# Patient Record
Sex: Female | Born: 1955 | Race: White | Hispanic: No | Marital: Married | Smoking: Former smoker
Health system: Southern US, Community
[De-identification: ages and names within clinical notes are randomized; demographics above are authoritative.]

## PROBLEM LIST (undated history)

## (undated) DIAGNOSIS — F419 Anxiety disorder, unspecified: Secondary | ICD-10-CM

## (undated) DIAGNOSIS — B1920 Unspecified viral hepatitis C without hepatic coma: Secondary | ICD-10-CM

## (undated) DIAGNOSIS — I10 Essential (primary) hypertension: Secondary | ICD-10-CM

## (undated) HISTORY — DX: Essential (primary) hypertension: I10

## (undated) HISTORY — DX: Anxiety disorder, unspecified: F41.9

## (undated) HISTORY — DX: Unspecified viral hepatitis C without hepatic coma: B19.20

---

## 2014-11-06 ENCOUNTER — Other Ambulatory Visit (HOSPITAL_COMMUNITY): Payer: Self-pay | Admitting: Nurse Practitioner

## 2014-11-06 DIAGNOSIS — B182 Chronic viral hepatitis C: Secondary | ICD-10-CM

## 2014-11-15 ENCOUNTER — Ambulatory Visit (HOSPITAL_COMMUNITY)
Admission: RE | Admit: 2014-11-15 | Discharge: 2014-11-15 | Disposition: A | Discharge status: Home / Self Care | Payer: 59 | Source: Ambulatory Visit | Attending: Nurse Practitioner | Admitting: Nurse Practitioner

## 2014-11-15 DIAGNOSIS — B182 Chronic viral hepatitis C: Secondary | ICD-10-CM | POA: Insufficient documentation

## 2014-11-15 DIAGNOSIS — I77811 Abdominal aortic ectasia: Secondary | ICD-10-CM | POA: Insufficient documentation

## 2014-11-15 DIAGNOSIS — K760 Fatty (change of) liver, not elsewhere classified: Secondary | ICD-10-CM | POA: Diagnosis not present

## 2015-01-31 ENCOUNTER — Encounter: Payer: Self-pay | Admitting: Internal Medicine

## 2015-01-31 ENCOUNTER — Ambulatory Visit (INDEPENDENT_AMBULATORY_CARE_PROVIDER_SITE_OTHER): Payer: 59 | Admitting: Internal Medicine

## 2015-01-31 VITALS — BP 180/109 | HR 68 | Temp 97.4°F | Ht 64.0 in | Wt 184.0 lb

## 2015-01-31 DIAGNOSIS — B182 Chronic viral hepatitis C: Secondary | ICD-10-CM | POA: Insufficient documentation

## 2015-01-31 MED ORDER — LEDIPASVIR-SOFOSBUVIR 90-400 MG PO TABS: 1.0000 | ORAL_TABLET | Freq: Every day | ORAL | Status: DC

## 2015-01-31 NOTE — Patient Instructions (Signed)
Date 01/31/15  Dear Ms Hart RochesterLawson, As discussed in the ID Clinic, your hepatitis C therapy will include the following medications:          Harvoni 90mg /400mg  tablet:           Take 1 tablet by mouth once daily   Please note that ALL MEDICATIONS WILL START ON THE SAME DATE for a total of 12 weeks. 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  Remember, each prescription is for 28 days. ---------------------------------------------------------------- GENERAL NOTES REGARDING YOUR HEPATITIS C MEDICATION:  SOFOSBUVIR/LEDIPASVIR (HARVONI): - Harvoni tablet is taken daily with OR without food. - The tablets are orange. - The tablets should be stored at room temperature.  - Acid reducing agents such as H2 blockers (ie. Pepcid (famotidine), Zantac (ranitidine), Tagamet (cimetidine), Axid (nizatidine) and proton pump inhibitors (ie. Prilosec (omeprazole), Protonix (pantoprazole), Nexium (esomeprazole), or Aciphex (rabeprazole)) can decrease effectiveness of Harvoni. Do not take until you have discussed with a health care provider.    -Antacids that contain magnesium and/or aluminum hydroxide (ie. Milk of Magensia, Rolaids, Gaviscon, Maalox, Mylanta, an dArthritis Pain Formula)can reduce absorption of Harvoni, so take them at least 4 hours before or after Harvoni.  -Calcium carbonate (calcium supplements or antacids such as Tums, Caltrate,  Os-Cal)needs to be taken at least 4 hours hours before or after Harvoni.  -St. John's wort or any products that contain St. John's wort like some herbal supplements  Please inform the office prior to starting any of these medications.  - The common side effects with Harvoni:      1. Fatigue      2. Headache      3. Nausea      4. Diarrhea      5. Insomnia   Support Path is a suite of resources designed to help patients start with HARVONI and move toward treatment completion GETTING STARTED Support Path helps patients access therapy and get off to an efficient start  Benefits investigation and prior authorization support Co-pay and other financial assistance A specialty pharmacy finder CO-PAY COUPON The HARVONI co-pay coupon may help eligible patients lower their out-of-pocket costs. With a co-pay coupon, most eligible patients may pay no more than $5 per co-pay (restrictions apply) www.harvoni.com call [phone] Not valid for patients enrolled in government healthcare prescription drug programs, such as Medicare Part D and Medicaid. Patients in the coverage gap known as the "donut hole" also are not eligible The HARVONI co-pay coupon program will cover the out-of-pocket costs for HARVONI prescriptions up to a maximum of 25% of the catalog price of a 12-week regimen of HARVONI  Please note that this only lists the most common side effects and is NOT a comprehensive list of the potential side effects of these medications. For more information, please review the drug information sheets that come with your medication package from the pharmacy.  ---------------------------------------------------------------- GENERAL HELPFUL HINTS ON HCV THERAPY: 1. No alcohol. 2. Protect against sun-sensitivity/sunburns (wear sunglasses, hat, long sleeves, pants and sunscreen).  3. Stay well-hydrated/well-moisturized. 4. Notify the ID Clinic of any changes in your other over-the-counter/herbal or  prescription medications. 5. If you miss a dose of your medication, take the missed dose as soon as you remember. Return to your regular time/dose schedule the next day.  6.  Do not stop taking your medications without first talking with your healthcare provider. 7.  You may take Tylenol (acetaminophen), as long as the dose is less than 2000 mg (OR no more than 4 tablets of the Tylenol Extra Strengths  tablet) in 24 hours. 8.  You will need to obtain routine labs and/or office visits at [facility] at weeks 4 and 12 as well as 12 and 24 weeks after completion of treatment.   Staci Righter, MD  [facility] [address] [place] [address] [place], [place]  [address] [phone]

## 2015-01-31 NOTE — Progress Notes (Signed)
+Shelby Barker is a 59 y.o. female who presents for initial evaluation and management of a positive Hepatitis C antibody test.  Patient tested positive this year by routine CDC recommended testing. Hepatitis C risk factors present are: none. Patient denies history of blood transfusion, intranasal drug use, IV drug abuse, multiple sexual partners, sexual contact with person with liver disease, tattoos. Patient has had other studies performed. Results: hepatitis C RNA by PCR, result: positive, elastography F3/4, Fibrosure F2. Patient has not had prior treatment for Hepatitis C. Patient does not have a past history of liver disease. Patient does not have a family history of liver disease.   HPI: [facility] but due to her new insurance, needs to be seen here instead.  Evaluation was done by them.  She denies alcohol or drug use.    Patient does not have documented immunity to Hepatitis A. Patient does not have documented immunity to Hepatitis B.     Review of Systems A comprehensive review of systems was negative.   Past Medical History  Diagnosis Date  . Hepatitis C virus infection without hepatic coma   . Hypertension   . Anxiety     Prior to Admission medications   Medication Sig Start Date End Date Taking? Authorizing Provider  ALPRAZolam Prudy Feeler(XANAX) 0.5 MG tablet Take 0.5 mg by mouth 2 (two) times daily as needed for anxiety.   Yes Historical Provider, MD  carvedilol (COREG) 12.5 MG tablet Take 12.5 mg by mouth 2 (two) times daily with a meal.   Yes Historical Provider, MD  hydrochlorothiazide (HYDRODIURIL) 12.5 MG tablet Take 12.5 mg by mouth daily.   Yes Historical Provider, MD  Multiple Vitamins-Minerals (MULTIVITAMIN WITH MINERALS) tablet Take 1 tablet by mouth daily.   Yes Historical Provider, MD  Ledipasvir-Sofosbuvir (HARVONI) 90-400 MG TABS Take 1 tablet by mouth daily. 01/31/15   Gardiner Barefootobert W Comer, MD    Allergies  Allergen Reactions  . Codeine     History   Substance Use Topics  . Smoking status: Former Smoker    Quit date: 08/25/2009  . Smokeless tobacco: Never Used  . Alcohol Use: 0.0 oz/week    0 Standard drinks or equivalent per week     Comment: occasional    No family history on file.    Objective:   Filed Vitals:   01/31/15 1451  BP: 180/109  Pulse: 68  Temp: 97.4 F (36.3 C)   in no apparent distress and alert HEENT: anicteric Cor RRR and No murmurs clear Bowel sounds are normal, liver is not enlarged, spleen is not enlarged peripheral pulses normal, no pedal edema, no clubbing or cyanosis negative for - jaundice, spider hemangioma, telangiectasia, palmar erythema, ecchymosis and atrophy  Laboratory Genotype: No results found for: HCVGENOTYPE HCV viral load: No results found for: HCVQUANT No results found for: WBC, HGB, HCT, MCV, PLT No results found for: CREATININE, BUN, NA, K, CL, CO2 No results found for: ALT, AST, GGT, ALKPHOS, BILITOT, INR    Assessment: Chronic Hepatitis C genotype 1a  Plan: 1) Patient counseled extensively on limiting acetaminophen to no more than 2 grams daily, avoidance of alcohol. 2) Transmission discussed with patient including sexual transmission, sharing razors and toothbrush.   3) Will need referral to gastroenterology if concern for cirrhosis 4) Will need referral for substance abuse counseling: No. 5) Will prescribe Harvoni for 12 weeks once work up complete 6) Hepatitis A vaccine Yes.  had one dose, next due in  November 2016.  Will do here.  7) Hepatitis B vaccine Yes.  already had 2 doses, third due in November 2016 8) will follow up after starting medication

## 2015-02-14 ENCOUNTER — Telehealth: Payer: Self-pay | Admitting: Pharmacist Clinician (PhC)/ Clinical Pharmacy Specialist

## 2015-02-14 NOTE — Telephone Encounter (Signed)
Shelby Barker was approved for Harvoni but only for 8 wks even though she has F3/4 on her elastography. An appeal was submitted and she has been approved for 12 wks now. Called her to let her know and to see if outpt pharmacy can fill it.

## 2015-02-19 ENCOUNTER — Encounter: Payer: Self-pay | Admitting: Pharmacist

## 2015-02-19 NOTE — Progress Notes (Signed)
Patient ID: Shelby Barker, female   DOB: [birth year], 59 y.o.   MRN: [id number]   Gaelyn Springfield to let her know that she will be receiving her Harvoni from [facility] and not our outpatient pharmacy. She actually called them this morning and set up delivery after receiving a letter in the mail. She is concerned that she will be on vacation when she should be getting her 28 day lab, I explained that it was okay, but she says she will call when she receives the medication to make sure it is okay for her to start. I went over that it is a once a day medication and she should take it everyday around the same time. Patient verbalized understanding. She will let us know what she gets the medication in the mail.  Thank you for allowing pharmacy to be part of this patient's care team  Shina Wass M. Philopateer Strine, Pharm.D Clinical Pharmacy Resident Pager: [phone] 02/19/2015 .2:41 PM

## 2015-03-27 ENCOUNTER — Other Ambulatory Visit: Payer: Self-pay | Admitting: Internal Medicine

## 2015-03-27 ENCOUNTER — Other Ambulatory Visit: Payer: 59

## 2015-03-27 DIAGNOSIS — B1920 Unspecified viral hepatitis C without hepatic coma: Secondary | ICD-10-CM

## 2015-03-27 LAB — CBC WITH DIFFERENTIAL/PLATELET
Basophils Absolute: 0.1 10*3/uL (ref 0.0–0.1)
Basophils Relative: 1 % (ref 0–1)
EOS ABS: 0.2 10*3/uL (ref 0.0–0.7)
EOS PCT: 3 % (ref 0–5)
HCT: 41.5 % (ref 36.0–46.0)
Hemoglobin: 14.7 g/dL (ref 12.0–15.0)
Lymphocytes Relative: 44 % (ref 12–46)
Lymphs Abs: 2.5 10*3/uL (ref 0.7–4.0)
MCH: 31.3 pg (ref 26.0–34.0)
MCHC: 35.4 g/dL (ref 30.0–36.0)
MCV: 88.5 fL (ref 78.0–100.0)
MONO ABS: 0.4 10*3/uL (ref 0.1–1.0)
MONOS PCT: 8 % (ref 3–12)
MPV: 9.5 fL (ref 8.6–12.4)
Neutro Abs: 2.5 10*3/uL (ref 1.7–7.7)
Neutrophils Relative %: 44 % (ref 43–77)
Platelets: 242 10*3/uL (ref 150–400)
RBC: 4.69 MIL/uL (ref 3.87–5.11)
RDW: 12.7 % (ref 11.5–15.5)
WBC: 5.6 10*3/uL (ref 4.0–10.5)

## 2015-03-27 LAB — COMPREHENSIVE METABOLIC PANEL
ALBUMIN: 4.3 g/dL (ref 3.6–5.1)
ALT: 28 U/L (ref 6–29)
AST: 29 U/L (ref 10–35)
Alkaline Phosphatase: 51 U/L (ref 33–130)
BUN: 13 mg/dL (ref 7–25)
CALCIUM: 9.7 mg/dL (ref 8.6–10.4)
CO2: 28 mmol/L (ref 20–31)
CREATININE: 0.87 mg/dL (ref 0.50–1.05)
Chloride: 98 mmol/L (ref 98–110)
Glucose, Bld: 86 mg/dL (ref 65–99)
POTASSIUM: 4.1 mmol/L (ref 3.5–5.3)
Sodium: 138 mmol/L (ref 135–146)
TOTAL PROTEIN: 6.9 g/dL (ref 6.1–8.1)
Total Bilirubin: 0.6 mg/dL (ref 0.2–1.2)

## 2015-03-28 LAB — HEPATITIS C RNA QUANTITATIVE: HCV Quantitative: NOT DETECTED IU/mL (ref <15)

## 2015-04-03 ENCOUNTER — Ambulatory Visit (INDEPENDENT_AMBULATORY_CARE_PROVIDER_SITE_OTHER): Payer: 59 | Admitting: Internal Medicine

## 2015-04-03 ENCOUNTER — Encounter: Payer: Self-pay | Admitting: Internal Medicine

## 2015-04-03 VITALS — BP 152/97 | HR 54 | Temp 97.9°F | Ht 64.0 in | Wt 183.0 lb

## 2015-04-03 DIAGNOSIS — K7400 Hepatic fibrosis, unspecified: Secondary | ICD-10-CM | POA: Insufficient documentation

## 2015-04-03 DIAGNOSIS — B182 Chronic viral hepatitis C: Secondary | ICD-10-CM

## 2015-04-03 NOTE — Progress Notes (Signed)
   Subjective:    Patient ID: Shelby Barker, female    DOB: 03-14-1956, 59 y.o.   MRN: [id number]  HPI Here for follow up of HCV.  Genotype 1a, elastography with F3/4 and Fibrosure with F2.  Normal platelets and albumin.  Started Harvoni and no issues.  Some fatigue and headache but mild and does not interfere with daily activities.  Initial viral load undetectable.     Review of Systems  Constitutional: Positive for fatigue.  Gastrointestinal: Negative for nausea and diarrhea.  Skin: Negative for rash.  Neurological: Positive for headaches. Negative for dizziness and light-headedness.       Objective:   Physical Exam  Constitutional: She appears well-developed and well-nourished. No distress.  HENT:  Mouth/Throat: No oropharyngeal exudate.  Eyes: No scleral icterus.  Cardiovascular: Normal rate, regular rhythm and normal heart sounds.   No murmur heard. Pulmonary/Chest: Effort normal and breath sounds normal. No respiratory distress.  Lymphadenopathy:    She has no cervical adenopathy.  Skin: No rash noted.          Assessment & Plan:

## 2015-04-03 NOTE — Assessment & Plan Note (Signed)
Unclear if cirrhosis.  Still likely needs EGD for varices screening and will need referral to GI via PCP with her insurance.  Will notify them.

## 2015-04-03 NOTE — Assessment & Plan Note (Signed)
Doing well with Harvoni.  Approved for 12 weeks.  RTC in 2 months.

## 2015-05-29 ENCOUNTER — Other Ambulatory Visit: Payer: 59

## 2015-05-29 DIAGNOSIS — B182 Chronic viral hepatitis C: Secondary | ICD-10-CM

## 2015-05-30 LAB — HEPATITIS C RNA QUANTITATIVE: HCV QUANT: NOT DETECTED [IU]/mL (ref <15)

## 2015-06-12 ENCOUNTER — Ambulatory Visit: Payer: 59 | Admitting: Internal Medicine

## 2015-07-05 ENCOUNTER — Ambulatory Visit (INDEPENDENT_AMBULATORY_CARE_PROVIDER_SITE_OTHER): Payer: 59 | Admitting: Internal Medicine

## 2015-07-05 ENCOUNTER — Encounter: Payer: Self-pay | Admitting: Internal Medicine

## 2015-07-05 VITALS — BP 173/100 | HR 60 | Temp 97.5°F | Ht 64.0 in | Wt 184.0 lb

## 2015-07-05 DIAGNOSIS — B182 Chronic viral hepatitis C: Secondary | ICD-10-CM

## 2015-07-05 DIAGNOSIS — K7400 Hepatic fibrosis, unspecified: Secondary | ICD-10-CM

## 2015-07-05 DIAGNOSIS — Z23 Encounter for immunization: Secondary | ICD-10-CM | POA: Diagnosis not present

## 2015-07-05 NOTE — Progress Notes (Signed)
   Subjective:    Patient ID: Randell LoopSandra Ortega, female    DOB: 03/28/1956, 59 y.o.   MRN: [id number]  HPI Here for follow up of HCV.  Genotype 1a, elastography with F3/4 and Fibrosure with F2.  Normal platelets and albumin.  Started Harvoni and now completed 12 weeks.  Viral load after treatment undetectable.  Pleased with results.  Starting a new job and unsure if she will be able to return for follow up.     Review of Systems  Constitutional: Negative for fatigue.  Gastrointestinal: Negative for nausea.  Neurological: Negative for light-headedness and headaches.       Objective:   Physical Exam  Constitutional: She appears well-developed and well-nourished. No distress.  HENT:  Mouth/Throat: No oropharyngeal exudate.  Eyes: No scleral icterus.  Cardiovascular: Normal rate, regular rhythm and normal heart sounds.   No murmur heard. Pulmonary/Chest: Effort normal and breath sounds normal. No respiratory distress.  Skin: No rash noted.          Assessment & Plan:

## 2015-07-05 NOTE — Assessment & Plan Note (Signed)
Finished treatment.  Will check SVR12 in about 3 months.

## 2015-07-05 NOTE — Assessment & Plan Note (Signed)
Will need HCC screen every 6 months, GI follow up.  Will need to wait for new insurance to see about referral.

## 2015-11-15 ENCOUNTER — Telehealth: Payer: Self-pay | Admitting: *Deleted

## 2015-11-15 NOTE — Telephone Encounter (Signed)
Is there a way her husband could be checked for Hep C?  Husband now has insurance but does not have a PCP.  Dr. Luciana Axeomer has told Shelby Barker that this could happen.  MD please advise.

## 2015-11-16 NOTE — Telephone Encounter (Signed)
He can go to [facility]. He would need a PCP for referral though, we don't take self referrals. thanks

## 2015-11-20 NOTE — Telephone Encounter (Signed)
Pt found [facility] a testing organization online which had [place].  Her husband was going there for testing.

## 2016-11-01 IMAGING — US US ABDOMEN COMPLETE W/ ELASTOGRAPHY
1 series · 13 of 25 positions shown · non-contrast
Comparison: None.

CLINICAL DATA: Chronic hepatitis-C.



[Series 1: us abdomen complete w/ elastography · 0.21mm/px · 13 of 73 slices shown]
[im 1/73]
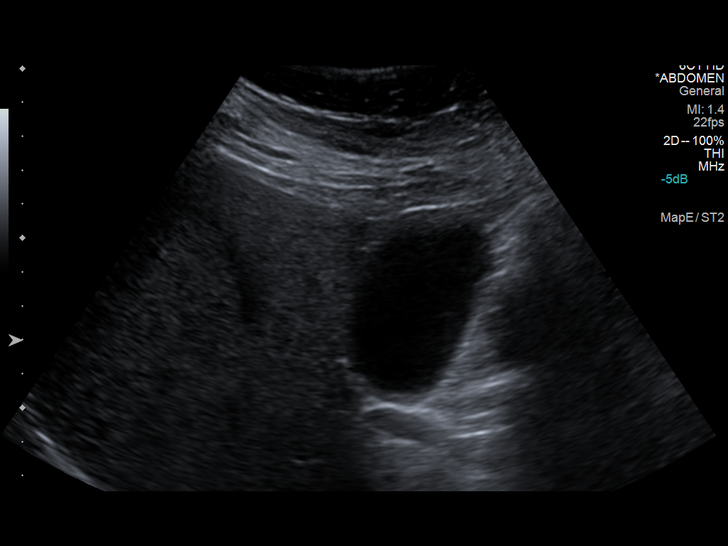
[im 7/73]
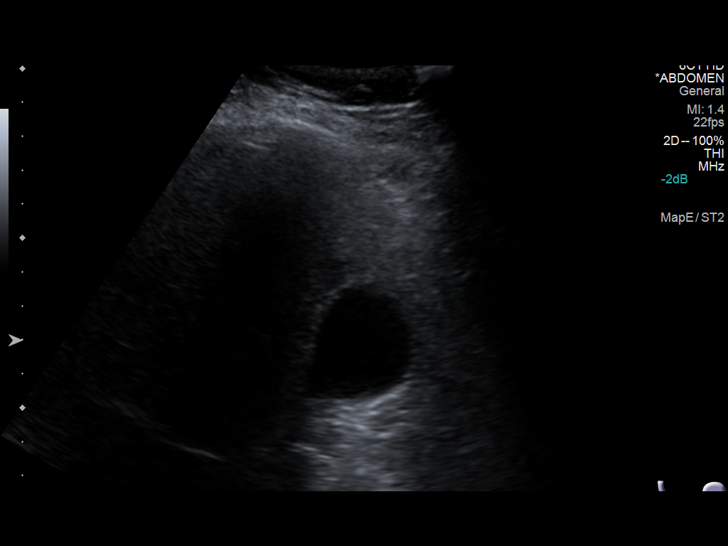
[im 13/73]
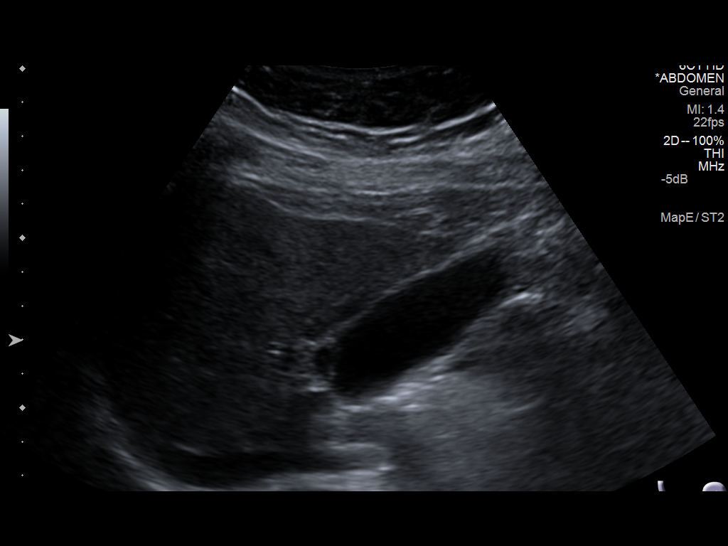
[im 19/73]
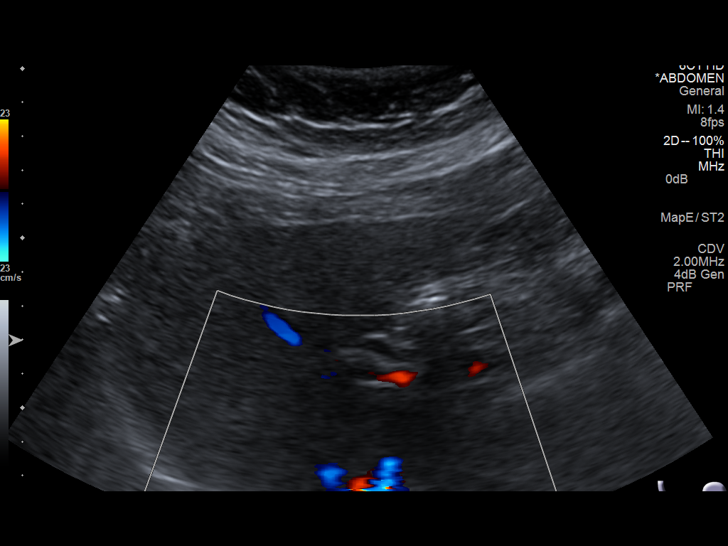
[im 25/73]
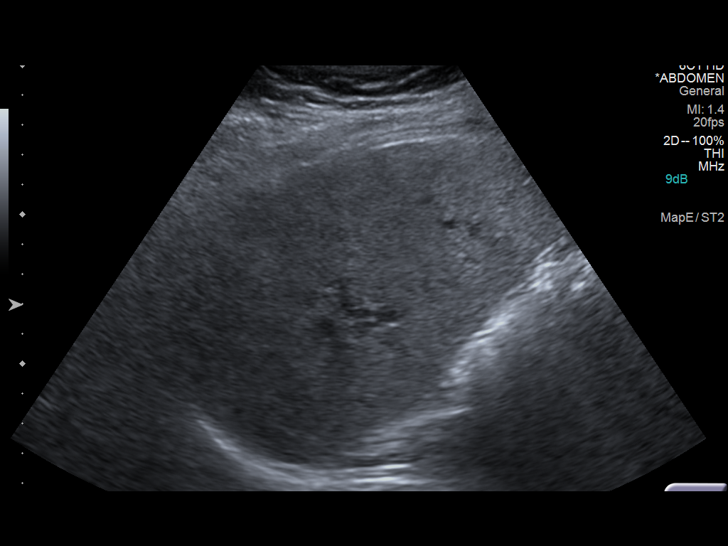
[im 31/73]
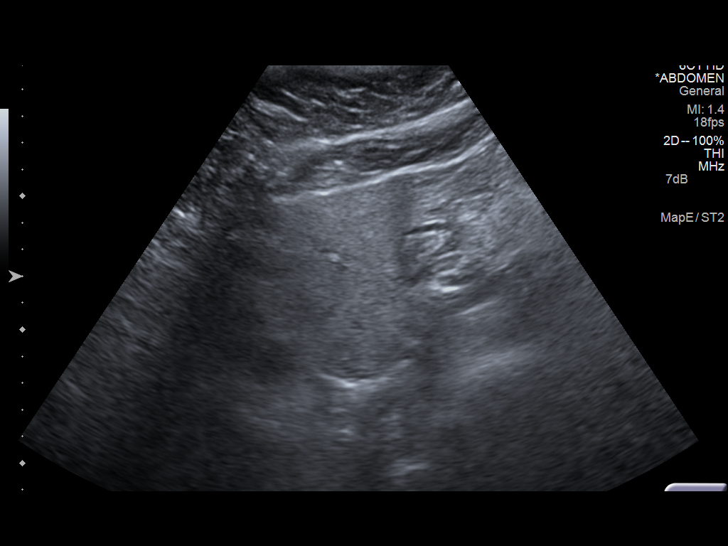
[im 37/73]
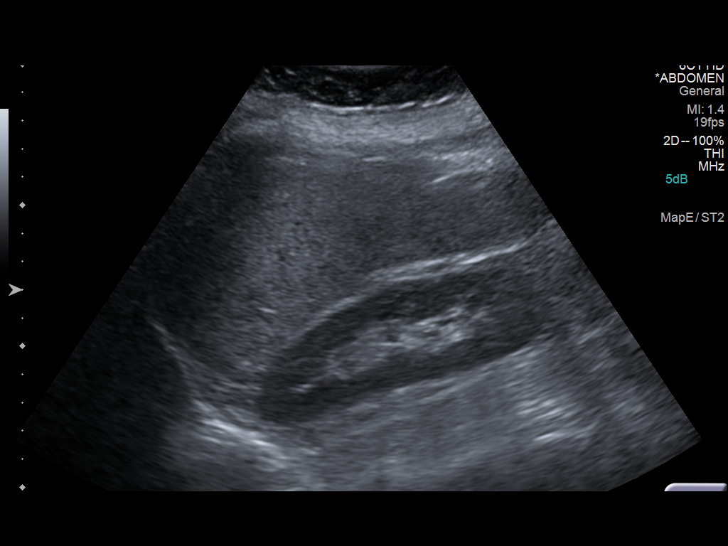
[im 43/73]
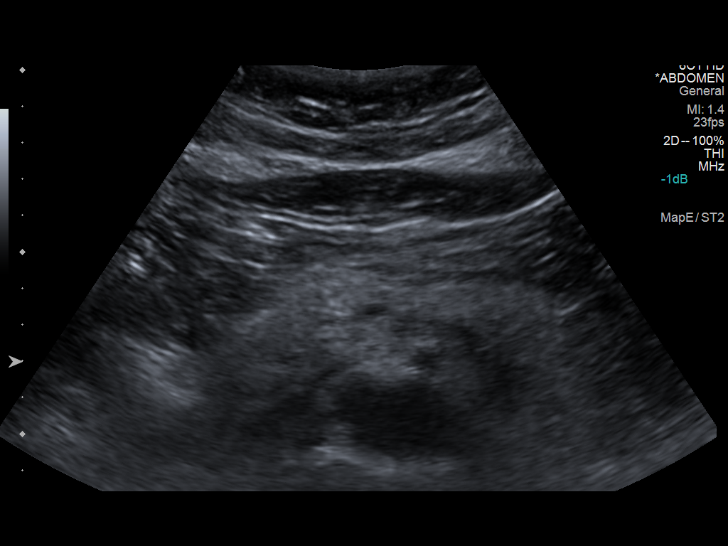
[im 49/73]
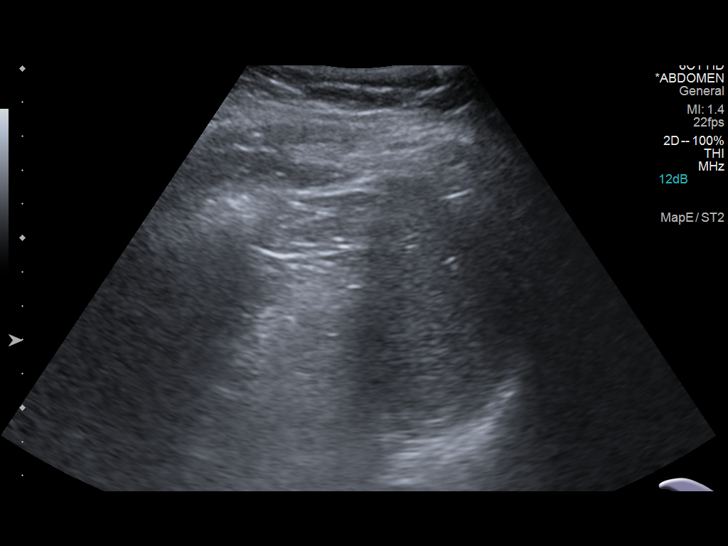
[im 55/73]
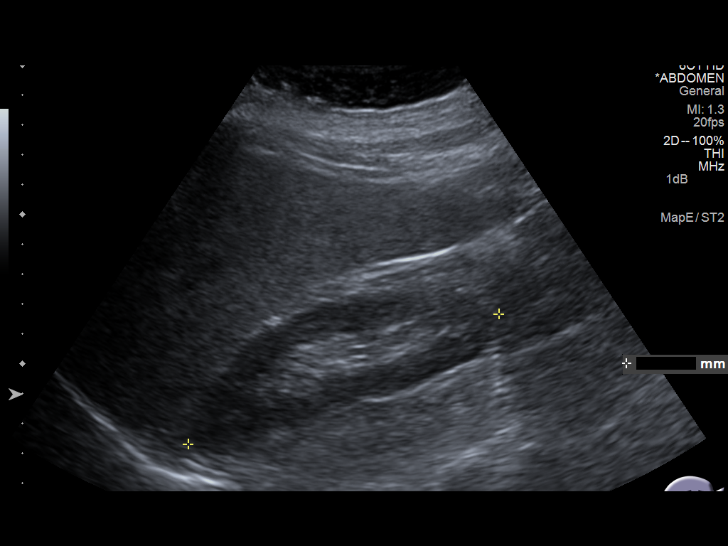
[im 61/73]
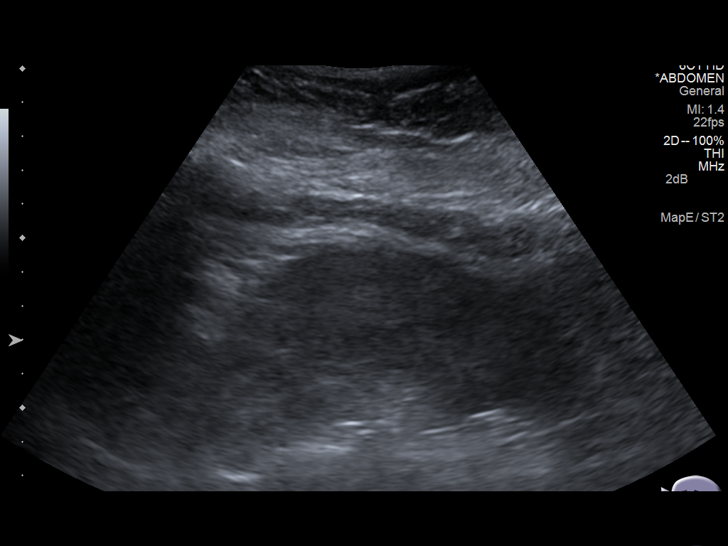
[im 67/73]
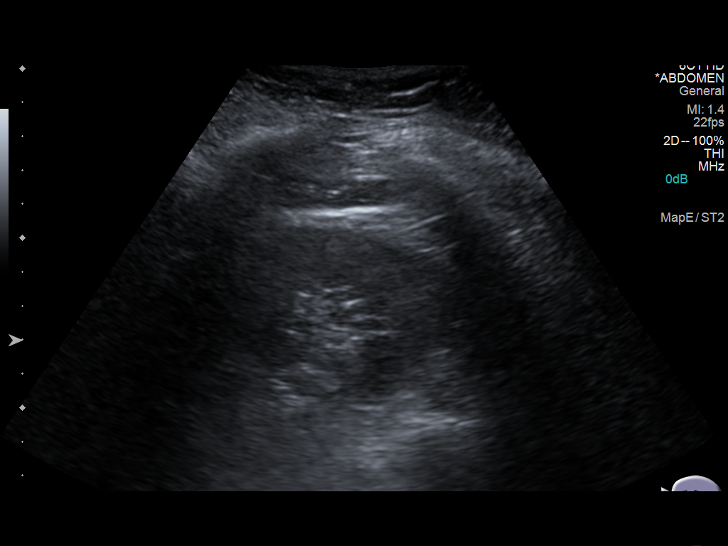
[im 73/73]
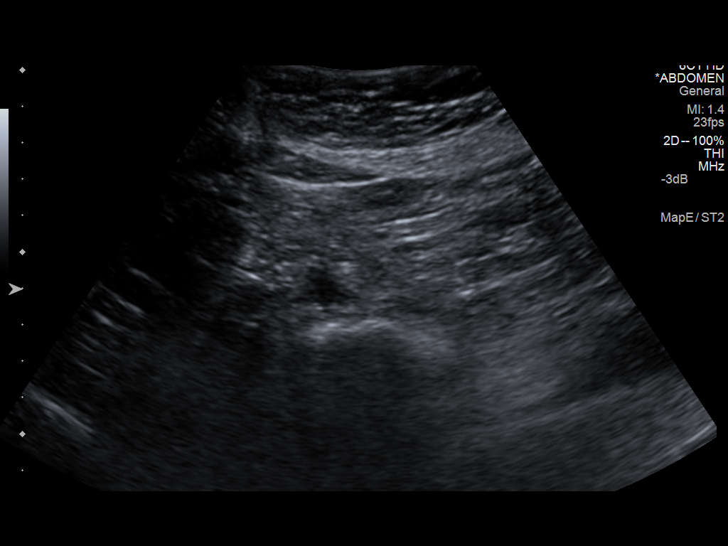

[13 of 25 positions shown; findings below may reference images not displayed]

FINDINGS: ULTRASOUND ABDOMEN

Gallbladder: No gallstones or wall thickening visualized. No
sonographic Murphy sign noted.

Common bile duct: Diameter: 5.5 mm

Liver: Increased parenchymal echogenicity. No focal liver
abnormality identified.

IVC: No abnormality visualized.

Pancreas: Visualized portion unremarkable.

Spleen: Size and appearance within normal limits.

Right Kidney: Length: 11.3 cm. Echogenicity within normal limits. No
mass or hydronephrosis visualized.

Left Kidney: Length: 10.6 cm. Echogenicity within normal limits. No
mass or hydronephrosis visualized.

Abdominal aorta: Measures 2.6 cm.

Other findings: None.

ULTRASOUND HEPATIC ELASTOGRAPHY

Device: Siemens Helix VTQ

Transducer 6C1

Patient position: Left lateral decubitus

Number of measurements:  10

Hepatic Segment:  8

Median velocity:   3.05  m/sec

IQR:

IQR/Median velocity ratio

Corresponding Metavir fibrosis score:  Some F3 and F4

Risk of fibrosis: High

Limitations of exam: No limitations.

Pertinent findings noted on other imaging exams: Echogenic
liver/hepatic steatosis.

Please note that abnormal shear wave velocities may also be
identified in clinical settings other than with hepatic fibrosis,
such as: acute hepatitis, elevated right heart and central venous
pressures including use of beta blockers, Pa Modou disease
(Bandyabatwenga), infiltrative processes such as
mastocytosis/amyloidosis/infiltrative tumor, extrahepatic
cholestasis, in the post-prandial state, and liver transplantation.
Correlation with patient history, laboratory data, and clinical
condition recommended.
IMPRESSION: 1. Hepatic steatosis.
2. Ectatic abdominal aorta at risk for aneurysm development.
Recommend followup by ultrasound in 5 years. This recommendation
follows ACR consensus guidelines: White Paper of the ACR Incidental
Findings Committee II on Vascular Findings. [HOSPITAL] 6796;
[DATE].

Median hepatic shear wave velocity is calculated at 3.05 m/sec.

Corresponding Metavir fibrosis score is some F 3 and F4..

Risk of fibrosis is high.

Follow-up:  Followup recommended

## 2016-11-01 IMAGING — US US ABDOMEN COMPLETE W/ ELASTOGRAPHY
1 series · 13 of 24 positions shown · non-contrast
Comparison: None.

CLINICAL DATA: Chronic hepatitis-C.



[Series 1: us abdomen complete w/ elastography · 0.14mm/px · 13 of 24 slices shown]
[im 1/24]
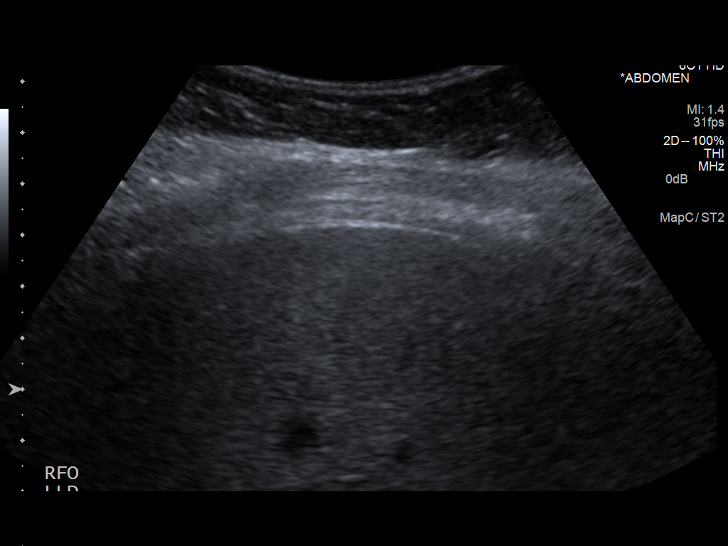
[im 3/24]
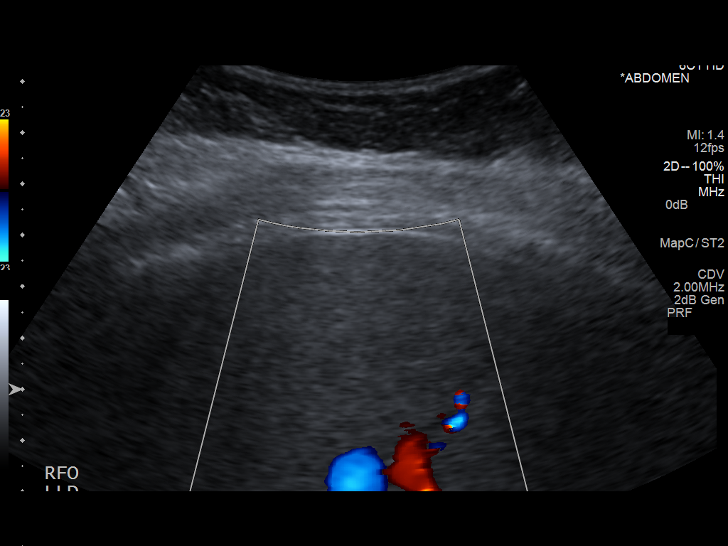
[im 5/24]
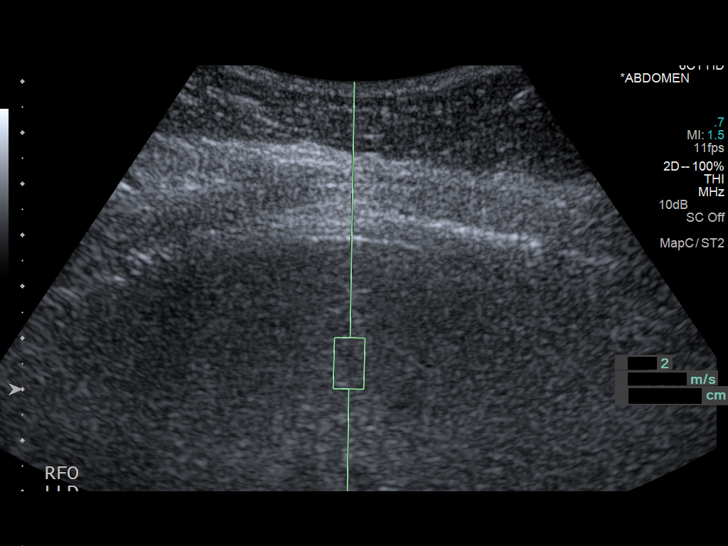
[im 7/24]
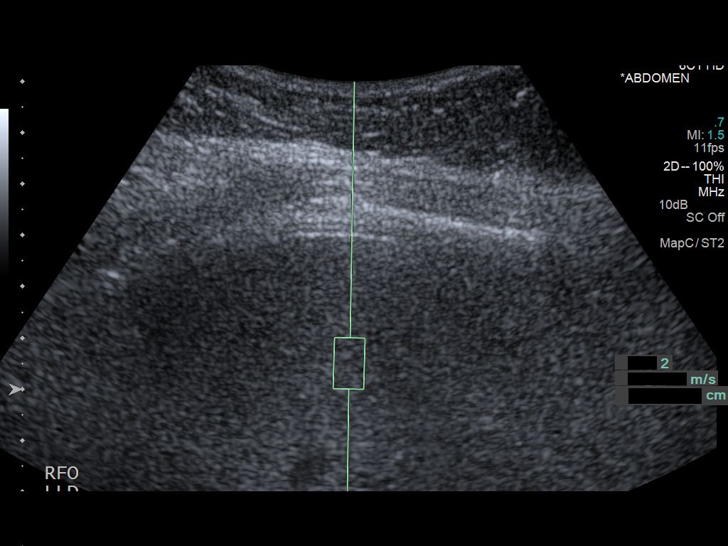
[im 9/24]
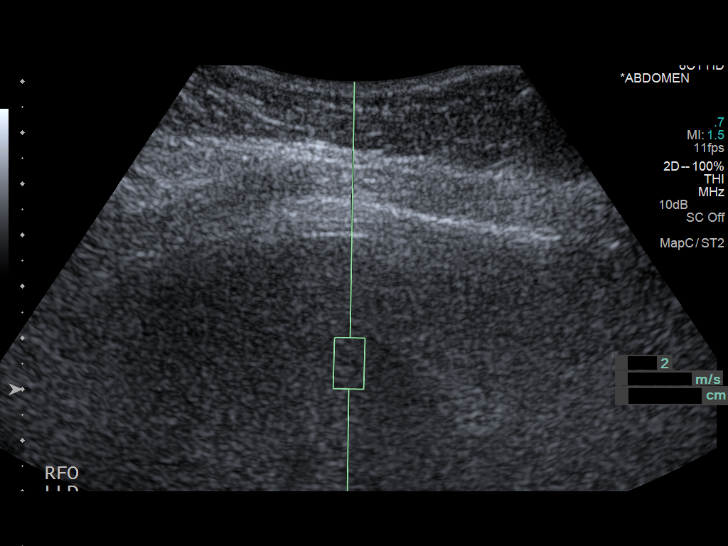
[im 11/24]
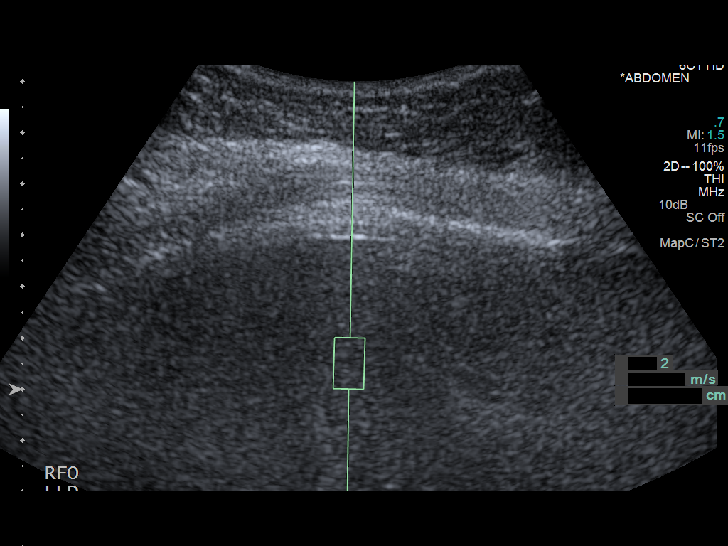
[im 13/24]
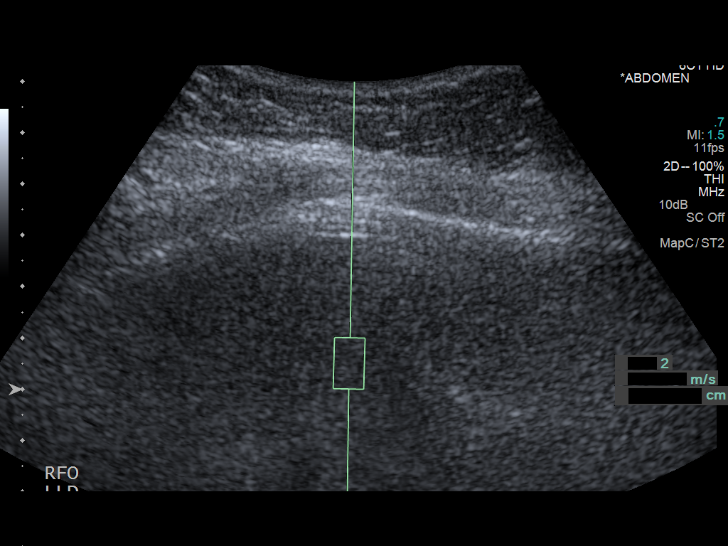
[im 14/24]
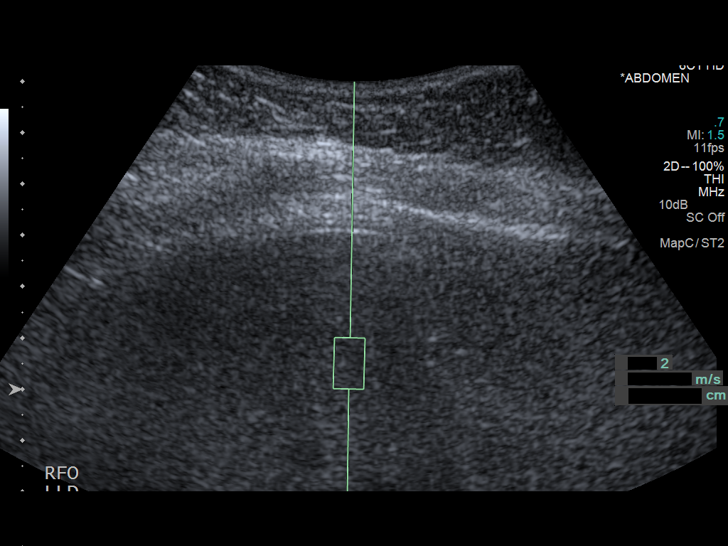
[im 16/24]
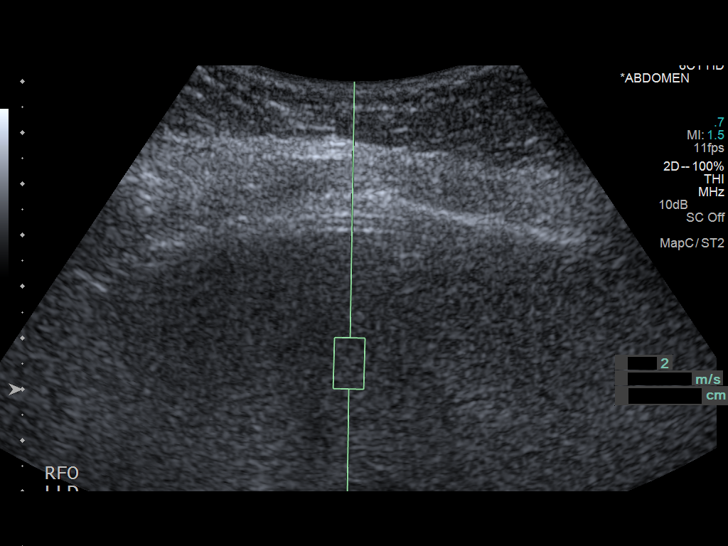
[im 18/24]
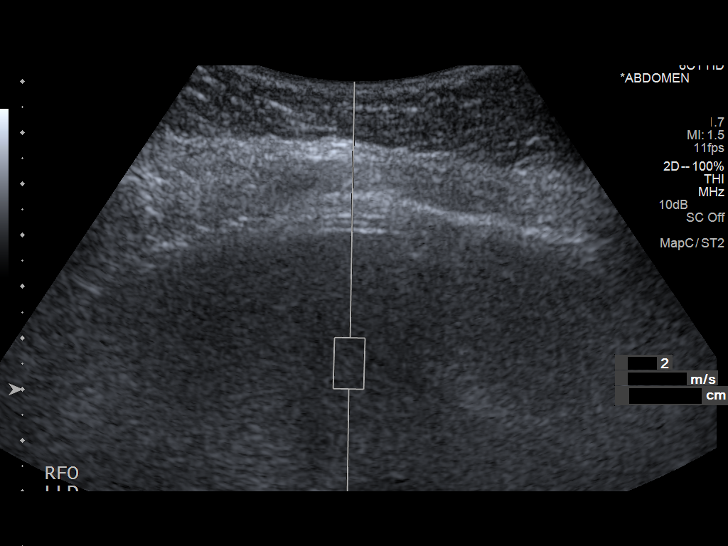
[im 20/24]
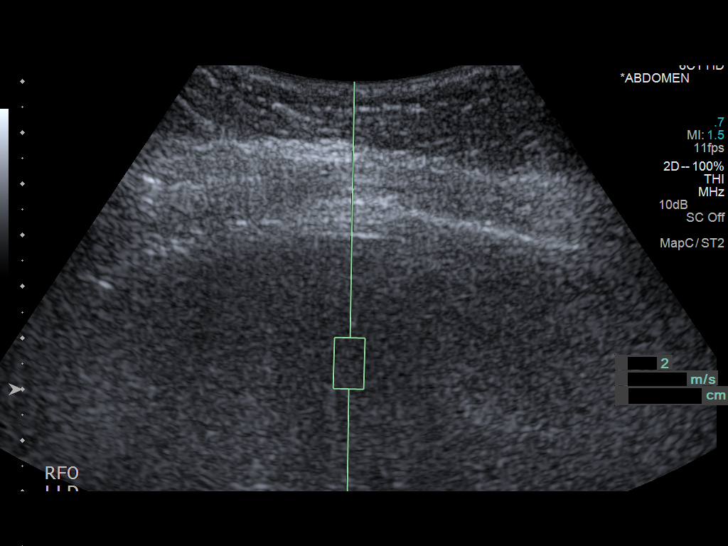
[im 22/24]
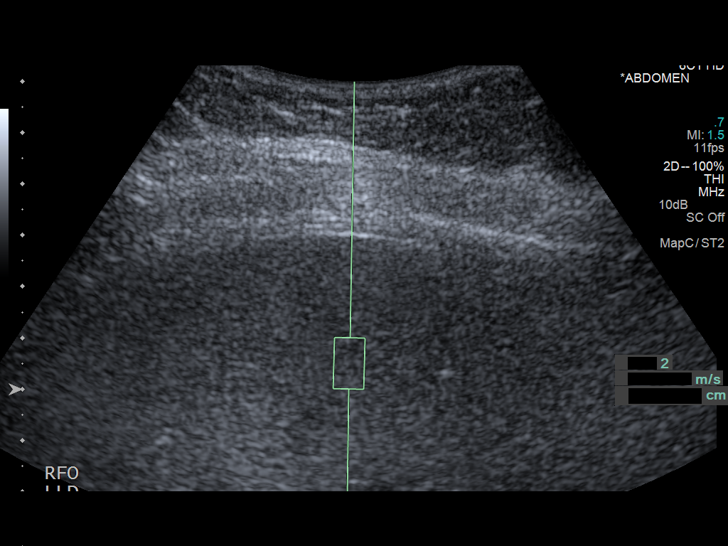
[im 24/24]
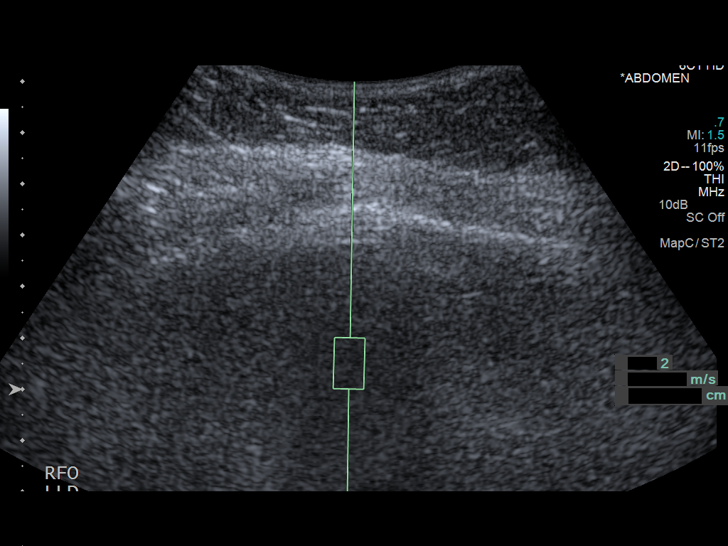

[13 of 24 positions shown; findings below may reference images not displayed]

FINDINGS: ULTRASOUND ABDOMEN

Gallbladder: No gallstones or wall thickening visualized. No
sonographic Murphy sign noted.

Common bile duct: Diameter: 5.5 mm

Liver: Increased parenchymal echogenicity. No focal liver
abnormality identified.

IVC: No abnormality visualized.

Pancreas: Visualized portion unremarkable.

Spleen: Size and appearance within normal limits.

Right Kidney: Length: 11.3 cm. Echogenicity within normal limits. No
mass or hydronephrosis visualized.

Left Kidney: Length: 10.6 cm. Echogenicity within normal limits. No
mass or hydronephrosis visualized.

Abdominal aorta: Measures 2.6 cm.

Other findings: None.

ULTRASOUND HEPATIC ELASTOGRAPHY

Device: Siemens Helix VTQ

Transducer 6C1

Patient position: Left lateral decubitus

Number of measurements:  10

Hepatic Segment:  8

Median velocity:   3.05  m/sec

IQR:

IQR/Median velocity ratio

Corresponding Metavir fibrosis score:  Some F3 and F4

Risk of fibrosis: High

Limitations of exam: No limitations.

Pertinent findings noted on other imaging exams: Echogenic
liver/hepatic steatosis.

Please note that abnormal shear wave velocities may also be
identified in clinical settings other than with hepatic fibrosis,
such as: acute hepatitis, elevated right heart and central venous
pressures including use of beta blockers, Pa Modou disease
(Bandyabatwenga), infiltrative processes such as
mastocytosis/amyloidosis/infiltrative tumor, extrahepatic
cholestasis, in the post-prandial state, and liver transplantation.
Correlation with patient history, laboratory data, and clinical
condition recommended.
IMPRESSION: 1. Hepatic steatosis.
2. Ectatic abdominal aorta at risk for aneurysm development.
Recommend followup by ultrasound in 5 years. This recommendation
follows ACR consensus guidelines: White Paper of the ACR Incidental
Findings Committee II on Vascular Findings. [HOSPITAL] 6796;
[DATE].

Median hepatic shear wave velocity is calculated at 3.05 m/sec.

Corresponding Metavir fibrosis score is some F 3 and F4..

Risk of fibrosis is high.

Follow-up:  Followup recommended

## 2023-05-13 ENCOUNTER — Emergency Department (HOSPITAL_BASED_OUTPATIENT_CLINIC_OR_DEPARTMENT_OTHER): Payer: Worker's Compensation

## 2023-05-13 ENCOUNTER — Emergency Department (HOSPITAL_BASED_OUTPATIENT_CLINIC_OR_DEPARTMENT_OTHER)
Admission: EM | Admit: 2023-05-13 | Discharge: 2023-05-13 | Disposition: A | Discharge status: Home / Self Care | Payer: Worker's Compensation | Attending: Emergency Medicine | Admitting: Emergency Medicine

## 2023-05-13 ENCOUNTER — Encounter (HOSPITAL_BASED_OUTPATIENT_CLINIC_OR_DEPARTMENT_OTHER): Payer: Self-pay | Admitting: Emergency Medicine

## 2023-05-13 ENCOUNTER — Other Ambulatory Visit: Payer: Self-pay

## 2023-05-13 DIAGNOSIS — Y990 Civilian activity done for income or pay: Secondary | ICD-10-CM | POA: Insufficient documentation

## 2023-05-13 DIAGNOSIS — I10 Essential (primary) hypertension: Secondary | ICD-10-CM | POA: Diagnosis not present

## 2023-05-13 DIAGNOSIS — S50311A Abrasion of right elbow, initial encounter: Secondary | ICD-10-CM | POA: Diagnosis not present

## 2023-05-13 DIAGNOSIS — W01198A Fall on same level from slipping, tripping and stumbling with subsequent striking against other object, initial encounter: Secondary | ICD-10-CM | POA: Diagnosis not present

## 2023-05-13 DIAGNOSIS — Z91040 Latex allergy status: Secondary | ICD-10-CM | POA: Insufficient documentation

## 2023-05-13 DIAGNOSIS — S0990XA Unspecified injury of head, initial encounter: Secondary | ICD-10-CM | POA: Diagnosis not present

## 2023-05-13 DIAGNOSIS — R001 Bradycardia, unspecified: Secondary | ICD-10-CM | POA: Insufficient documentation

## 2023-05-13 DIAGNOSIS — Z79899 Other long term (current) drug therapy: Secondary | ICD-10-CM | POA: Insufficient documentation

## 2023-05-13 DIAGNOSIS — M542 Cervicalgia: Secondary | ICD-10-CM | POA: Insufficient documentation

## 2023-05-13 DIAGNOSIS — T148XXA Other injury of unspecified body region, initial encounter: Secondary | ICD-10-CM

## 2023-05-13 DIAGNOSIS — W19XXXA Unspecified fall, initial encounter: Secondary | ICD-10-CM

## 2023-05-13 DIAGNOSIS — S59901A Unspecified injury of right elbow, initial encounter: Secondary | ICD-10-CM | POA: Diagnosis present

## 2023-05-13 MED ORDER — OXYCODONE-ACETAMINOPHEN 5-325 MG PO TABS
1.0000 | ORAL_TABLET | Freq: Once | ORAL | Status: AC
  Administered 2023-05-13: 1 via ORAL
  Filled 2023-05-13: qty 1

## 2023-05-13 MED ORDER — METHOCARBAMOL 500 MG PO TABS: 500.0000 mg | ORAL_TABLET | Freq: Two times a day (BID) | ORAL | 0 refills | Status: AC

## 2023-05-13 MED ORDER — KETOROLAC TROMETHAMINE 15 MG/ML IJ SOLN: 15.0000 mg | Freq: Once | INTRAMUSCULAR | Status: DC

## 2023-05-13 NOTE — ED Provider Notes (Signed)
[facility] Provider Note   CSN: [id number] Arrival date & time: 05/13/23  1640     History Chief Complaint  Patient presents with   Head Injury    Shelby Barker is a 67 y.o. female with h/o HTN, fibromyalgia presents to the emergency department today for evaluation of headache and neck pain after head injury yesterday.  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  She is unsure whether or not she lost consciousness or not.  She reports that she has had a worsening headache since then gradually.  Feels bandlike in sensation.  She denies any visual changes or disturbances.  She denies any weakness in her upper or lower extremities.  Denies any trouble talking.  She reports that she feels like she leans more to the right with ambulation but is still able to walk without falling.  She denies any numbness or tingling in her extremities.  She denies any chest or belly pain.  Denies any chest pain or shortness of breath.  She reports that she does have some rib pain whenever she laughs however does not have any pain with deep inspiration or upon palpation.  She does have an abrasion to her right elbow however she reports that there is no pain upon movement of her arms or legs.  She denies any fecal or urinary incontinence.  Denies any saddle anesthesia.  Denies any fever or any IV drug use.  Family member at bedside that she is been acting appropriate at her baseline.   Head Injury Associated symptoms: headache and neck pain   Associated symptoms: no nausea and no vomiting        Home Medications Prior to Admission medications   Medication Sig Start Date End Date Taking? Authorizing Provider  ALPRAZolam Prudy Feeler) 0.5 MG tablet Take 0.5 mg by mouth 2  (two) times daily as needed for anxiety.    [provider]  carvedilol (COREG) 12.5 MG tablet Take 12.5 mg by mouth 2 (two) times daily with a meal.    [provider]  hydrochlorothiazide (HYDRODIURIL) 12.5 MG tablet Take 12.5 mg by mouth daily.    [provider]  Multiple Vitamins-Minerals (MULTIVITAMIN WITH MINERALS) tablet Take 1 tablet by mouth daily.    [provider]      Allergies    Codeine and Latex    Review of Systems   Review of Systems  Constitutional:  Negative for chills and fever.  Eyes:  Negative for photophobia and visual disturbance.  Respiratory:  Negative for shortness of breath.   Cardiovascular:  Negative for chest pain.  Gastrointestinal:  Negative for abdominal pain, nausea and vomiting.  Musculoskeletal:  Positive for back pain and neck pain. Negative for neck stiffness.  Neurological:  Positive for headaches. Negative for dizziness, speech difficulty and light-headedness.    Physical Exam Updated Vital Signs BP (!) 192/97 (BP Location: Right Arm)   Pulse (!) 52   Temp 98.1 F (36.7 C) (Oral)   Resp 18   Ht 5\' 4"  (1.626 m)   Wt 78.5 kg   SpO2 97%   BMI 29.70 kg/m  Physical Exam Vitals and nursing note reviewed.  Constitutional:      General: She is not  in acute distress.    Appearance: She is not toxic-appearing.  HENT:     Head: Normocephalic and atraumatic.     Comments: No battle signs or raccoon eyes.     Right Ear: Tympanic membrane, ear canal and external ear normal.     Left Ear: Tympanic membrane, ear canal and external ear normal.     Nose: Nose normal.     Mouth/Throat:     Mouth: Mucous membranes are moist.  Eyes:     General: No scleral icterus.    Extraocular Movements: Extraocular movements intact.     Pupils: Pupils are equal, round, and reactive to light.  Neck:     Comments: In C-collar Cardiovascular:     Rate and Rhythm: Bradycardia present.  Pulmonary:     Effort: Pulmonary  effort is normal. No respiratory distress.     Comments: Chest is nontender to palpation.  No signs of trauma.  No step-offs or deformities. Chest:     Chest wall: No tenderness.  Abdominal:     Palpations: Abdomen is soft.     Tenderness: There is no abdominal tenderness. There is no guarding or rebound.     Comments: Nontender to palpation.  No overlying skin changes.  No signs of trauma  Musculoskeletal:     Right lower leg: No edema.     Left lower leg: No edema.     Comments: Superficial abrasions seen to the right elbow.  She is nontender to the bilateral upper or lower extremities.  She is Palpable radial, DP, and PT pulses.  Compartments are soft throughout.  She reports sensations intact and equal and symmetric bilaterally in her upper and lower extremities.  Strength is 5 out of 5 in the patient's upper and lower extremities.  No lower extremity edema appreciated.  Skin:    General: Skin is warm and dry.  Neurological:     General: No focal deficit present.     Mental Status: She is alert and oriented to person, place, and time. Mental status is at baseline.     ED Results / Procedures / Treatments   Labs (all labs ordered are listed, but only abnormal results are displayed) Labs Reviewed - No data to display  EKG None  Radiology No results found.  Procedures Procedures   Medications Ordered in ED Medications - No data to display  ED Course/ Medical Decision Making/ A&P    Medical Decision Making Amount and/or Complexity of Data Reviewed Radiology: ordered.   67 y.o. female presents to the ER for evaluation of head pain after head injury as well as diffuse back pain. Differential diagnosis includes but is not limited to trauma. Vital signs elevated blood pressure, mild bradycardia otherwise unremarkable. Physical exam as noted above.   She reports some rib pain only whenever she laughs but does not have any with deep inspiration noted she feel any chest pain  or shortness of breath.  She has no tenderness to her ribs upon palpation either.  There is no tenderness to her chest or belly.  She is equal and symmetric strength in upper and lower bilateral extremities.  Reportedly sensations intact bilaterally as well.  She has palpable radial, DP, and PT pulses that are symmetric.  Compartments are soft throughout.  There is an abrasion to her right elbow otherwise do not see any other signs of trauma.  There is no bruising to her chest, belly, or back.  She does not have any facial  pain or any facial trauma.  No battle signs or raccoon eyes.  I am unable to assess her neck or the rest of her cranial nerves at this time because of the c-collar.  Will need to be reassessed once CT is clear to rule out any ligamentous injury concerning for possible hyperextension of the neck.   CT imaging is pending at this time.   6:59 PM Care of Shelby Barker  transferred to [facility] at the end of my shift as the patient will require reassessment once labs/imaging have resulted. Patient presentation, ED course, and plan of care discussed with review of all pertinent labs and imaging. Please see his/her note for further details regarding further ED course and disposition. Plan at time of handoff is follow up with imaging, ambulate the patient, evaluate neck for possible ligament injury. This may be altered or completely changed at the discretion of the oncoming team pending results of further workup.  Portions of this report may have been transcribed using voice recognition software. Every effort was made to ensure accuracy; however, inadvertent computerized transcription errors may be present.   I discussed this case with my attending physician who cosigned this note including patient's presenting symptoms, physical exam, and planned diagnostics and interventions. Attending physician stated agreement with plan or made changes to plan which were implemented.   Final  Clinical Impression(s) / ED Diagnoses Final diagnoses:  None    Rx / DC Orders ED Discharge Orders     None         Achille Rich, PA-C 05/13/23 1910    Terrilee Files, MD 05/14/23 1100

## 2023-05-13 NOTE — ED Notes (Signed)
Patient transported to CT 

## 2023-05-13 NOTE — ED Triage Notes (Addendum)
Pt drives a school bus.  Yesterday at work, she walked into the mirror of the bus, fell backwards and hit the ground on the back of her head.  Pt thinks she may have lost consciousness.  Pt also has abrasion to right elbow.  Pt c/o pain to back of her head, headache, neck pain and shoulders.  Pt also c/o rib pain.  Pt BP in 200's.  Pt states she has taken her BP medication.  No blurry vision, no Nausea.

## 2023-05-13 NOTE — ED Provider Notes (Signed)
  Accepted handoff at shift change from [facility]. Please see prior provider note for more detail.   Briefly: Patient is 67 y.o. "headache and neck pain after head injury yesterday.  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  She is unsure whether or not she lost consciousness or not.  She reports that she has had a worsening headache since then gradually.  Feels bandlike in sensation.  She denies any visual changes or disturbances.  She denies any weakness in her upper or lower extremities.  Denies any trouble talking.  She reports that she feels like she leans more to the right with ambulation but is still able to walk without falling.  She denies any numbness or tingling in her extremities.  She denies any chest or belly pain.  Denies any chest pain or shortness of breath.  She reports that she does have some rib pain whenever she laughs however does not have any pain with deep inspiration or upon palpation.  She does have an abrasion to her right elbow however she reports that there is no pain upon movement of her arms or legs.  She denies any fecal or urinary incontinence.  Denies any saddle anesthesia.  Denies any fever or any IV drug use.  Family member at bedside that she is been acting appropriate at her baseline. "  DDX: concern for intracranial hemorrhage, subdural/epidural hematoma, vertebral fracture, spinal cord injury, muscle strain, skull fracture, fracture.  Plan:  - Dispo pending imaging - CT without concern for fractures, dislocation, or intracranial abnormalities. -Neuro Exam unremarkable: GCS 15. Speech is goal oriented. No deficits appreciated to CN III-XII; symmetric eyebrow raise, no facial drooping, tongue midline. Patient has equal grip strength bilaterally with 5/5  strength against resistance in all major muscle groups bilaterally. Sensation to light touch intact. Patient moves extremities without ataxia. Normal finger-nose-finger. Patient ambulatory with steady gait. -Patient does have paraspinal muscle tenderness to palpation. No spinal tenderness to palpation. - Patient with HTN during ED stay but denying any alarming symptoms of HTN such as vision changes, chest pain, dyspnea. Patient stating that she has history of white coat HTN. I provided patient with pain management to try and bring her BP down. BP still elevated - patient requesting to go home and states that she would prefer to follow up with her PCP given her HTN. Provided patient with return precautions. - Prescribed patient muscle relaxers for her pain and educated patient on alternating Advil and Tylenol for further pain management. Patient stating that she does not need narcotic pain management at this time.  - Patient afebrile with stable vitals. Provided return precautions. Discharged in good condition.   DDx: These are considered less likely due to history of present illness and physical exam findings -Intracranial hemorrhage, subdural/epidural hematoma: CT without concern; no neurodeficits -Vertebral fracture: CT without concern - Spinal cord injury: CT without concern; no neurodeficits -Skull fracture: No postauricular ecchymosis, no periorbital ecchymosis, CT without concern       Dorthy Cooler, PA-C 05/14/23 1014    Terrilee Files, MD 05/14/23 1104

## 2023-05-13 NOTE — Discharge Instructions (Addendum)
It was a pleasure caring for you today. Imaging was reassuring. Please follow up with your primary care provider in the next 48-72 hours for HTN management. Seek emergency care if experiencing any new or worsening symptoms.  Alternating between 650 mg Tylenol and 400 mg Advil: The best way to alternate taking Acetaminophen (example Tylenol) and Ibuprofen (example Advil/Motrin) is to take them 3 hours apart. For example, if you take ibuprofen at 6 am you can then take Tylenol at 9 am. You can continue this regimen throughout the day, making sure you do not exceed the recommended maximum dose for each drug.
